# Patient Record
Sex: Male | Born: 1985 | Race: Black or African American | Hispanic: No | Marital: Married | Smoking: Never smoker
Health system: Southern US, Community
[De-identification: ages and names within clinical notes are randomized; demographics above are authoritative.]

## PROBLEM LIST (undated history)

## (undated) DIAGNOSIS — E785 Hyperlipidemia, unspecified: Secondary | ICD-10-CM

---

## 2013-12-20 ENCOUNTER — Encounter: Payer: Self-pay | Admitting: Emergency Medicine

## 2013-12-20 ENCOUNTER — Emergency Department
Admission: EM | Admit: 2013-12-20 | Discharge: 2013-12-20 | Disposition: A | Discharge status: Home / Self Care | Payer: BC Managed Care – PPO | Source: Home / Self Care | Attending: Family Medicine | Admitting: Family Medicine

## 2013-12-20 DIAGNOSIS — J029 Acute pharyngitis, unspecified: Secondary | ICD-10-CM

## 2013-12-20 HISTORY — DX: Hyperlipidemia, unspecified: E78.5

## 2013-12-20 LAB — POCT RAPID STREP A (OFFICE): Rapid Strep A Screen: NEGATIVE

## 2013-12-20 MED ORDER — AZITHROMYCIN 250 MG PO TABS: ORAL_TABLET | ORAL | Status: DC

## 2013-12-20 NOTE — ED Provider Notes (Signed)
CSN: [id number]     Arrival date & time 12/20/13  [id number] History   First MD Initiated Contact with Patient 12/20/13 1002     Chief Complaint  Patient presents with  . Sore Throat      HPI Comments: Patient was awakened at 3:30am today with a sore throat, hoarseness, and chest congestion.  Today he is fatigued, hoarse, has nasal congestion, and left ear feels clogged.  The history is provided by the patient.    Past Medical History  Diagnosis Date  . Hyperlipidemia     last checked one year ago   History reviewed. No pertinent past surgical history. Family History  Problem Relation Age of Onset  . Hypertension Father   . Diabetes Maternal Aunt    History  Substance Use Topics  . Smoking status: Never Smoker   . Smokeless tobacco: Not on file  . Alcohol Use: No    Review of Systems + sore throat + hoarse + cough No pleuritic pain No wheezing + nasal congestion ? post-nasal drainage No sinus pain/pressure No itchy/red eyes ? left earache No hemoptysis No SOB No fever/chills No nausea No vomiting No abdominal pain No diarrhea No urinary symptoms No skin rash + fatigue No myalgias No headache    Allergies  Review of patient's allergies indicates no known allergies.  Home Medications   Prior to Admission medications   Medication Sig Start Date End Date Taking? Authorizing Provider  azithromycin (ZITHROMAX Z-PAK) 250 MG tablet Take 2 tabs today; then begin one tab once daily for 4 more days. (Rx void after 12/28/13) 12/20/13   Lattie HawStephen A Beese, MD   BP 126/87  Pulse 72  Temp(Src) 98 F (36.7 C) (Oral)  Resp 16  Ht 5\' 10"  (1.778 m)  Wt 280 lb (127.007 kg)  BMI 40.18 kg/m2  SpO2 100% Physical Exam Nursing notes and Vital Signs reviewed. Appearance:  Patient appears stated age, and in no acute distress.  Patient is obese (BMI  40.2)           Eyes:  Pupils are equal, round, and reactive to light and accomodation.  Extraocular movement is intact.   Conjunctivae are not inflamed  Ears:  Canals normal.  Tympanic membranes normal.  Nose:  Mildly congested turbinates.  No sinus tenderness.   Pharynx:  Mildly erythematous Neck:  Supple.  Tender enlarged left posterior nodes are palpated bilaterally  Lungs:  Clear to auscultation.  Breath sounds are equal.  Heart:  Regular rate and rhythm without murmurs, rubs, or gallops.  Abdomen:  Nontender without masses or hepatosplenomegaly.  Bowel sounds are present.  No CVA or flank tenderness.  Extremities:  No edema.  No calf tenderness Skin:  No rash present.   ED Course  Procedures  none    Labs Reviewed  STREP A DNA PROBE  POCT RAPID STREP A (OFFICE) negative      MDM   1. Acute pharyngitis; suspect early viral URI    Throat culture pending.  There is no evidence of bacterial infection today.   Treat symptomatically for now: As cold symptoms develop, begin taking plain Mucinex (1200 mg guaifenesin) twice daily for cough and congestion.  May add Sudafed for sinus congestion.   Increase fluid intake, rest. May use Afrin nasal spray (or generic oxymetazoline) twice daily for about 5 days.  Also recommend using saline nasal spray several times daily and saline nasal irrigation (AYR is a common brand) Try warm salt water gargles for sore  throat.  Stop all antihistamines for now, and other non-prescription cough/cold preparations. May take Ibuprofen 200mg , 4 tabs every 8 hours with food for sore throat, headache, etc. Begin Azithromycin if throat culture positive, if not improving about one week, or if persistent fever develops (Given a prescription to hold, with an expiration date)  Follow-up with family doctor if not improving about10 days.      Lattie HawStephen A Beese, MD 12/20/13 [phone]

## 2013-12-20 NOTE — Discharge Instructions (Signed)
As cold symptoms develop, begin taking plain Mucinex (1200 mg guaifenesin) twice daily for cough and congestion.  May add Sudafed for sinus congestion.   Increase fluid intake, rest. May use Afrin nasal spray (or generic oxymetazoline) twice daily for about 5 days.  Also recommend using saline nasal spray several times daily and saline nasal irrigation (AYR is a common brand) Try warm salt water gargles for sore throat.  Stop all antihistamines for now, and other non-prescription cough/cold preparations. May take Ibuprofen 200mg , 4 tabs every 8 hours with food for sore throat, headache, etc. Begin Azithromycin if throat culture positive, if not improving about one week, or if persistent fever develops.  Follow-up with family doctor if not improving about10 days.    Salt Water Gargle This solution will help make your mouth and throat feel better. HOME CARE INSTRUCTIONS   Mix 1 teaspoon of salt in 8 ounces of warm water.  Gargle with this solution as much or often as you need or as directed. Swish and gargle gently if you have any sores or wounds in your mouth.  Do not swallow this mixture. Document Released: 03/28/2004 Document Revised: 09/16/2011 Document Reviewed: 08/19/2008 [facility] Patient Information 2014 [employer], [place].

## 2013-12-20 NOTE — ED Notes (Deleted)
Reports intermittent pain in left lateral upper rib cage: worse upon inspiration, sneezing, changing of position; started about 2 weeks ago at time of URI. Also has some intermittent pain at waist level left lateral.

## 2013-12-20 NOTE — ED Notes (Signed)
Reports onset sore throat last night; has had Strep throat so wanted to catch early.

## 2013-12-21 LAB — STREP A DNA PROBE: GASP: NEGATIVE

## 2013-12-23 ENCOUNTER — Telehealth: Payer: Self-pay | Admitting: Emergency Medicine

## 2014-04-23 ENCOUNTER — Emergency Department
Admission: EM | Admit: 2014-04-23 | Discharge: 2014-04-23 | Disposition: A | Discharge status: Home / Self Care | Payer: BC Managed Care – PPO | Source: Home / Self Care | Attending: Emergency Medicine | Admitting: Emergency Medicine

## 2014-04-23 ENCOUNTER — Encounter: Payer: Self-pay | Admitting: Emergency Medicine

## 2014-04-23 DIAGNOSIS — J0100 Acute maxillary sinusitis, unspecified: Secondary | ICD-10-CM

## 2014-04-23 MED ORDER — AMOXICILLIN-POT CLAVULANATE 875-125 MG PO TABS: 1.0000 | ORAL_TABLET | Freq: Two times a day (BID) | ORAL | Status: DC

## 2014-04-23 NOTE — ED Provider Notes (Signed)
CSN: [id number]     Arrival date & time 04/23/14  1324 History   First MD Initiated Contact with Patient 04/23/14 1357     Chief Complaint  Patient presents with  . Nasal Congestion  . Cough  . Hoarse  . Eye Pain  . Otalgia  . Headache   (Consider location/radiation/quality/duration/timing/severity/associated sxs/prior Treatment) Patient is a 28 y.o. male presenting with cough, eye pain, ear pain, and headaches. The history is provided by the patient. No language interpreter was used.  Cough Cough characteristics:  Productive Sputum characteristics:  Clear Severity:  Moderate Onset quality:  Gradual Duration:  6 days Timing:  Constant Progression:  Worsening Chronicity:  New Context: upper respiratory infection   Relieved by:  Nothing Worsened by:  Nothing tried Ineffective treatments:  None tried Associated symptoms: ear pain, headaches, rhinorrhea and shortness of breath   Risk factors: no recent infection   Eye Pain Associated symptoms include headaches and shortness of breath.  Otalgia Associated symptoms: cough, headaches and rhinorrhea   Headache Associated symptoms: cough, drainage, ear pain, eye pain and sinus pressure     Past Medical History  Diagnosis Date  . Hyperlipidemia     last checked one year ago   No past surgical history on file. Family History  Problem Relation Age of Onset  . Hypertension Father   . Diabetes Maternal Aunt    History  Substance Use Topics  . Smoking status: Never Smoker   . Smokeless tobacco: Not on file  . Alcohol Use: No    Review of Systems  Unable to perform ROS HENT: Positive for ear pain, postnasal drip, rhinorrhea and sinus pressure.   Eyes: Positive for pain.  Respiratory: Positive for cough and shortness of breath.   Neurological: Positive for headaches.  All other systems reviewed and are negative.   Allergies  Review of patient's allergies indicates no known allergies.  Home Medications   Prior to  Admission medications   Medication Sig Start Date End Date Taking? Authorizing Provider  azithromycin (ZITHROMAX Z-PAK) 250 MG tablet Take 2 tabs today; then begin one tab once daily for 4 more days. (Rx void after 12/28/13) 12/20/13   Lattie HawStephen A Beese, MD   BP 147/93  Pulse 90  Temp(Src) 97.5 F (36.4 C) (Oral)  Resp 18  Ht 5' 10.5" (1.791 m)  Wt 288 lb (130.636 kg)  BMI 40.73 kg/m2  SpO2 100% Physical Exam  Nursing note and vitals reviewed. Constitutional: He is oriented to person, place, and time. He appears well-developed and well-nourished.  HENT:  Head: Normocephalic.  Right Ear: External ear normal.  Mouth/Throat: Oropharynx is clear and moist.  Tender facial sinuses,     Eyes: Conjunctivae and EOM are normal. Pupils are equal, round, and reactive to light.  Neck: Normal range of motion.  Pulmonary/Chest: Effort normal.  Abdominal: Soft. He exhibits no distension.  Musculoskeletal: Normal range of motion.  Neurological: He is alert and oriented to person, place, and time.  Psychiatric: He has a normal mood and affect.    ED Course  Procedures (including critical care time) Labs Review Labs Reviewed - No data to display  Imaging Review No results found.   MDM Pt has had symptoms for 6 days,  Green nasal drainage and facila discomfort   1. Acute maxillary sinusitis, recurrence not specified    Augmentin Return if any problems. AVS    Elson AreasLeslie K Halim Surrette, PA-C 04/23/14 1408

## 2014-04-23 NOTE — Discharge Instructions (Signed)

## 2014-04-23 NOTE — ED Notes (Signed)
Reports feeling ill x 6 days; no fever; does have congestion, cough, hoarseness, ear pain, eye pain, headache and body aches.

## 2014-04-25 NOTE — ED Provider Notes (Signed)
Medical history/examination/treatment/procedure(s) were performed by non-physician provider and as supervising physician I was immediately available for consultation/collaboration.   Lajean Manesavid Massey, MD 04/25/14 1010

## 2014-08-11 ENCOUNTER — Emergency Department
Admission: EM | Admit: 2014-08-11 | Discharge: 2014-08-11 | Disposition: A | Discharge status: Home / Self Care | Payer: BLUE CROSS/BLUE SHIELD | Source: Home / Self Care | Attending: Emergency Medicine | Admitting: Emergency Medicine

## 2014-08-11 ENCOUNTER — Encounter: Payer: Self-pay | Admitting: Emergency Medicine

## 2014-08-11 DIAGNOSIS — H65112 Acute and subacute allergic otitis media (mucoid) (sanguinous) (serous), left ear: Secondary | ICD-10-CM

## 2014-08-11 MED ORDER — AMOXICILLIN-POT CLAVULANATE 875-125 MG PO TABS: 1.0000 | ORAL_TABLET | Freq: Two times a day (BID) | ORAL | Status: DC

## 2014-08-11 NOTE — ED Notes (Signed)
Left ear pian x 2 days

## 2014-08-11 NOTE — ED Provider Notes (Signed)
CSN: [id number]     Arrival date & time 08/11/14  9147 History   First MD Initiated Contact with Patient 08/11/14 [phone]     Chief Complaint  Patient presents with  . Otalgia   (Consider location/radiation/quality/duration/timing/severity/associated sxs/prior Treatment) HPI URI HISTORY  Steven Collier is a 29 y.o. male who complains of onset of cold symptoms for 2 days, progressing to moderate left ear pain. Some associated postnasal drainage and sinus pain and congestion.  Have been using over-the-counter treatment which helps a little bit.  No chills/sweats No definite Fever  +  Nasal congestion Minimal Discolored Post-nasal drainage Mild sinus pain/pressure Mild sore throat  No cough No wheezing No chest congestion No hemoptysis No shortness of breath No pleuritic pain  No itchy/red eyes No earache  No nausea No vomiting No abdominal pain No diarrhea  No skin rashes +  Fatigue No myalgias No headache   Past Medical History  Diagnosis Date  . Hyperlipidemia     last checked one year ago   History reviewed. No pertinent past surgical history. Family History  Problem Relation Age of Onset  . Hypertension Father   . Diabetes Maternal Aunt    History  Substance Use Topics  . Smoking status: Never Smoker   . Smokeless tobacco: Not on file  . Alcohol Use: No    Review of Systems  All other systems reviewed and are negative.   Allergies  Review of patient's allergies indicates no known allergies.  Home Medications   Prior to Admission medications   Medication Sig Start Date End Date Taking? Authorizing Provider  amoxicillin-clavulanate (AUGMENTIN) 875-125 MG per tablet Take 1 tablet by mouth 2 (two) times daily. For 10 days. Take with food. 08/11/14   Lajean Manes, MD   BP 133/85 mmHg  Pulse 56  Temp(Src) 98.1 F (36.7 C) (Oral)  Ht  (1.778 m)  Wt 290 lb (131.543 kg)  BMI 41.61 kg/m2  SpO2 98% Physical Exam  Constitutional: He is oriented to  person, place, and time. He appears well-developed and well-nourished. No distress.  HENT:  Head: Normocephalic and atraumatic.  Right Ear: Tympanic membrane, external ear and ear canal normal.  Left Ear: External ear and ear canal normal. Tympanic membrane is injected and erythematous.  Nose: Mucosal edema and rhinorrhea present. Right sinus exhibits maxillary sinus tenderness. Left sinus exhibits maxillary sinus tenderness.  Mouth/Throat: Oropharynx is clear and moist. No oral lesions.  Left ear: TM red. Intact. External canal: Moderate wax, but patent. No swelling or inflammation of the external canal  Eyes: Conjunctivae are normal. No scleral icterus.  Neck: Neck supple.  Cardiovascular: Normal rate, regular rhythm and normal heart sounds.   Pulmonary/Chest: Effort normal and breath sounds normal.  Lymphadenopathy:    He has no cervical adenopathy.  Neurological: He is alert and oriented to person, place, and time.  Skin: Skin is warm and dry.  Nursing note and vitals reviewed.   ED Course  Procedures (including critical care time) Labs Review Labs Reviewed - No data to display  Imaging Review No results found.   MDM   1. Acute mucoid otitis media of left ear    with early maxillary sinusitis. No evidence of left otitis externa. He has mild to moderate wax left ear canal but no obstruction.  Treatment options discussed, as well as risks, benefits, alternatives. We discussed home irrigation of ear canals, discussed how to do this safely. He declined irrigation of left ear canal here in urgent  care today. Patient voiced understanding and agreement with the following plans: Augmentin 875 twice a day 10 days Mucinex and other symptomatic care. Tylenol or ibuprofen when necessary pain. Follow-up with your primary care doctor in 5-7 days if not improving, or sooner if symptoms become worse. Precautions discussed. Red flags discussed. Questions invited and answered. Patient  voiced understanding and agreement.      Lajean Manesavid Massey, MD 08/11/14 [phone]

## 2015-12-27 ENCOUNTER — Encounter: Payer: Self-pay | Admitting: Emergency Medicine

## 2015-12-27 ENCOUNTER — Emergency Department
Admission: EM | Admit: 2015-12-27 | Discharge: 2015-12-27 | Disposition: A | Discharge status: Home / Self Care | Payer: BLUE CROSS/BLUE SHIELD | Source: Home / Self Care | Attending: Family Medicine | Admitting: Family Medicine

## 2015-12-27 DIAGNOSIS — L298 Other pruritus: Secondary | ICD-10-CM

## 2015-12-27 MED ORDER — TRIAMCINOLONE ACETONIDE 0.1 % EX CREA: 1.0000 "application " | TOPICAL_CREAM | Freq: Two times a day (BID) | CUTANEOUS | Status: DC

## 2015-12-27 MED ORDER — HYDROXYZINE HCL 25 MG PO TABS: 25.0000 mg | ORAL_TABLET | Freq: Four times a day (QID) | ORAL | Status: DC | PRN

## 2015-12-27 MED ORDER — PREDNISONE 20 MG PO TABS: ORAL_TABLET | ORAL | Status: DC

## 2015-12-27 MED ORDER — METHYLPREDNISOLONE ACETATE 40 MG/ML IJ SUSP
40.0000 mg | Freq: Once | INTRAMUSCULAR | Status: AC
  Administered 2015-12-27: 40 mg via INTRAMUSCULAR

## 2015-12-27 NOTE — Discharge Instructions (Signed)
Atarax (hydroxizine) is an antihistamine that can be taken to help with itching. This medication can cause drowsiness so do not drive or drink alcohol while taking.    You were given a shot of Depo-medrol (a steroid) today to help with itching and swelling from a likely allergic reaction.  You have been prescribed 3 days of prednisone, an oral steroid.  You may start this medication tomorrow with breakfast.

## 2015-12-27 NOTE — ED Provider Notes (Signed)
CSN: [id number]     Arrival date & time 12/27/15  1002 History   First MD Initiated Contact with Patient 12/27/15 1016     Chief Complaint  Patient presents with  . Rash   (Consider location/radiation/quality/duration/timing/severity/associated sxs/prior Treatment) HPI Steven Collier is a 30 y.o. male presenting to UC with c/o erythematous moderately pruritic rash on arms and legs for 1 week. Rash also has a mild burning sensation. He has used OTC benadryl and hydrocortisone cream without relief. no known allergies.  He notes a while back he did have an allergic reaction to laundry detergent at his grandparents a few months ago but denies recent new soaps, lotions, detergents, foods, or medications. Denies swelling of mouth or difficulty breathing. Denies n/v.    Past Medical History  Diagnosis Date  . Hyperlipidemia     last checked one year ago   History reviewed. No pertinent past surgical history. Family History  Problem Relation Age of Onset  . Hypertension Father   . Diabetes Maternal Aunt    Social History  Substance Use Topics  . Smoking status: Never Smoker   . Smokeless tobacco: None  . Alcohol Use: No    Review of Systems  Constitutional: Negative for fever and chills.  Respiratory: Negative for shortness of breath, wheezing and stridor.   Gastrointestinal: Negative for nausea and vomiting.  Skin: Positive for color change and rash. Negative for wound.    Allergies  Review of patient's allergies indicates no known allergies.  Home Medications   Prior to Admission medications   Medication Sig Start Date End Date Taking? Authorizing Provider  amoxicillin-clavulanate (AUGMENTIN) 875-125 MG per tablet Take 1 tablet by mouth 2 (two) times daily. For 10 days. Take with food. 08/11/14   Lajean Manes, MD  hydrOXYzine (ATARAX/VISTARIL) 25 MG tablet Take 1 tablet (25 mg total) by mouth every 6 (six) hours as needed for itching. 12/27/15   Junius Finner, PA-C  predniSONE  (DELTASONE) 20 MG tablet 3 tabs po day one, then 2 po daily x 4 days 12/27/15   Junius Finner, PA-C  triamcinolone cream (KENALOG) 0.1 % Apply 1 application topically 2 (two) times daily. 12/27/15   Junius Finner, PA-C   Meds Ordered and Administered this Visit   Medications  methylPREDNISolone acetate (DEPO-MEDROL) injection 40 mg (40 mg Intramuscular Given 12/27/15 1027)    BP 151/90 mmHg  Pulse 54  Temp(Src) 97.4 F (36.3 C) (Oral)  Ht  (1.778 m)  Wt 276 lb (125.193 kg)  BMI 39.60 kg/m2  SpO2 98% No data found.   Physical Exam  Constitutional: He is oriented to person, place, and time. He appears well-developed and well-nourished.  HENT:  Head: Normocephalic and atraumatic.  Eyes: EOM are normal.  Neck: Normal range of motion.  Cardiovascular: Bradycardia present.   Mild bradycardia, regular rhythm  Pulmonary/Chest: Effort normal.  Musculoskeletal: Normal range of motion.  Neurological: He is alert and oriented to person, place, and time.  Skin: Skin is warm and dry. Rash noted. There is erythema.  Diffuse erythematous maculopapular rash on arms and legs. Small areas of dried skin.   Psychiatric: He has a normal mood and affect. His behavior is normal.  Nursing note and vitals reviewed.   ED Course  Procedures (including critical care time)  Labs Review Labs Reviewed - No data to display  Imaging Review No results found.    MDM   1. Pruritic erythematous rash    Rash c/w allergic reaction. No evidence  of anaphylaxis. Vitals- unremarkable. No evidence of underlying infection.   Tx in UC: depo-medrol 40mg  IM  Rx: Prednisone, triamcinolone, and atarax  Encouraged f/u with PCP in 1 week if not improving, sooner if worsening. Patient verbalized understanding and agreement with treatment plan.    Junius Finnerrin O'Malley, PA-C 12/27/15 1108

## 2015-12-27 NOTE — ED Notes (Signed)
Rash on arms and legs x 1 week, itches and burns

## 2016-06-01 ENCOUNTER — Telehealth: Payer: Self-pay | Admitting: Emergency Medicine

## 2016-06-01 NOTE — Telephone Encounter (Signed)
Pt called regarding an abcess on his back, states that it is draining and wanted to know if there's anything else he needs to do?  Pt states that it's no pus, just blood coming out.  Advised hard to make a recommendation without looking at the area.  Pt will decide if he wants to come in and have the area looked at.  TMartin,CMA

## 2016-07-29 ENCOUNTER — Emergency Department
Admission: EM | Admit: 2016-07-29 | Discharge: 2016-07-29 | Disposition: A | Discharge status: Home / Self Care | Payer: BLUE CROSS/BLUE SHIELD | Source: Home / Self Care

## 2016-07-29 ENCOUNTER — Encounter: Payer: Self-pay | Admitting: Emergency Medicine

## 2016-07-29 ENCOUNTER — Emergency Department (INDEPENDENT_AMBULATORY_CARE_PROVIDER_SITE_OTHER): Payer: BLUE CROSS/BLUE SHIELD

## 2016-07-29 DIAGNOSIS — I517 Cardiomegaly: Secondary | ICD-10-CM

## 2016-07-29 DIAGNOSIS — R222 Localized swelling, mass and lump, trunk: Secondary | ICD-10-CM

## 2016-07-29 NOTE — ED Provider Notes (Signed)
CSN: [id number]     Arrival date & time 07/29/16  1151 History   None    Chief Complaint  Patient presents with  . Mass   (Consider location/radiation/quality/duration/timing/severity/associated sxs/prior Treatment) Pt reports he has a knot on his chest.  Pt reports he noticed 3 days ago.  Pt has slight tenderness.  No known injury no fever no cough   The history is provided by the patient. No language interpreter was used.    Past Medical History:  Diagnosis Date  . Hyperlipidemia    last checked one year ago   History reviewed. No pertinent surgical history. Family History  Problem Relation Age of Onset  . Hypertension Father   . Diabetes Maternal Aunt    Social History  Substance Use Topics  . Smoking status: Never Smoker  . Smokeless tobacco: Never Used  . Alcohol use No    Review of Systems  All other systems reviewed and are negative.   Allergies  Patient has no known allergies.  Home Medications   Prior to Admission medications   Not on File   Meds Ordered and Administered this Visit  Medications - No data to display  BP 141/98 (BP Location: Left Arm)   Pulse (!) 55   Temp 97.7 F (36.5 C) (Oral)   Ht 5\' 10"  (1.778 m)   Wt 273 lb (123.8 kg)   SpO2 100%   BMI 39.17 kg/m  No data found.   Physical Exam  Constitutional: He appears well-developed and well-nourished.  HENT:  Head: Normocephalic.  Pulmonary/Chest: He exhibits tenderness.  5mm tender area left of sternum, slightly tender   Abdominal: Soft.    Urgent Care Course     Procedures (including critical care time)  Labs Review Labs Reviewed - No data to display  Imaging Review Dg Chest 2 View  Result Date: 07/29/2016 CLINICAL DATA:  Nonpainful mass felt by patient inferior to the left sternoclavicular joint for the past 3 days. EXAM: CHEST  2 VIEW COMPARISON:  None. FINDINGS: Borderline enlarged cardiac silhouette. Clear lungs with normal vascularity. Normal appearing bones.  IMPRESSION: Borderline cardiomegaly. No acute abnormality. No radiographically visible mass. Electronically Signed   By: Beckie SaltsSteven  Reid M.D.   On: 07/29/2016 13:41     Visual Acuity Review  Right Eye Distance:   Left Eye Distance:   Bilateral Distance:    Right Eye Near:   Left Eye Near:    Bilateral Near:         MDM   1. Chest wall mass    I advised pt to follow up with primary for recheck.  Pt advised he may need ultrasound if area persist    Elson AreasLeslie K Alvin Rubano, [place] JerseyPA-C 07/29/16 1417

## 2016-07-29 NOTE — Discharge Instructions (Signed)
Have your Physicain recheck area of swelling.  If area is still present they may want to schedule you for an ultrasound

## 2016-07-29 NOTE — ED Triage Notes (Signed)
Lump in upper left center of chest x 3 days, nothing visible to the eye

## 2018-02-01 IMAGING — DX DG CHEST 2V
2 series · 2 of 2 positions shown · non-contrast
Comparison: None.

CLINICAL DATA: Nonpainful mass felt by patient inferior to the left
sternoclavicular joint for the past 3 days.

EXAM:
CHEST  2 VIEW

[chest pa]
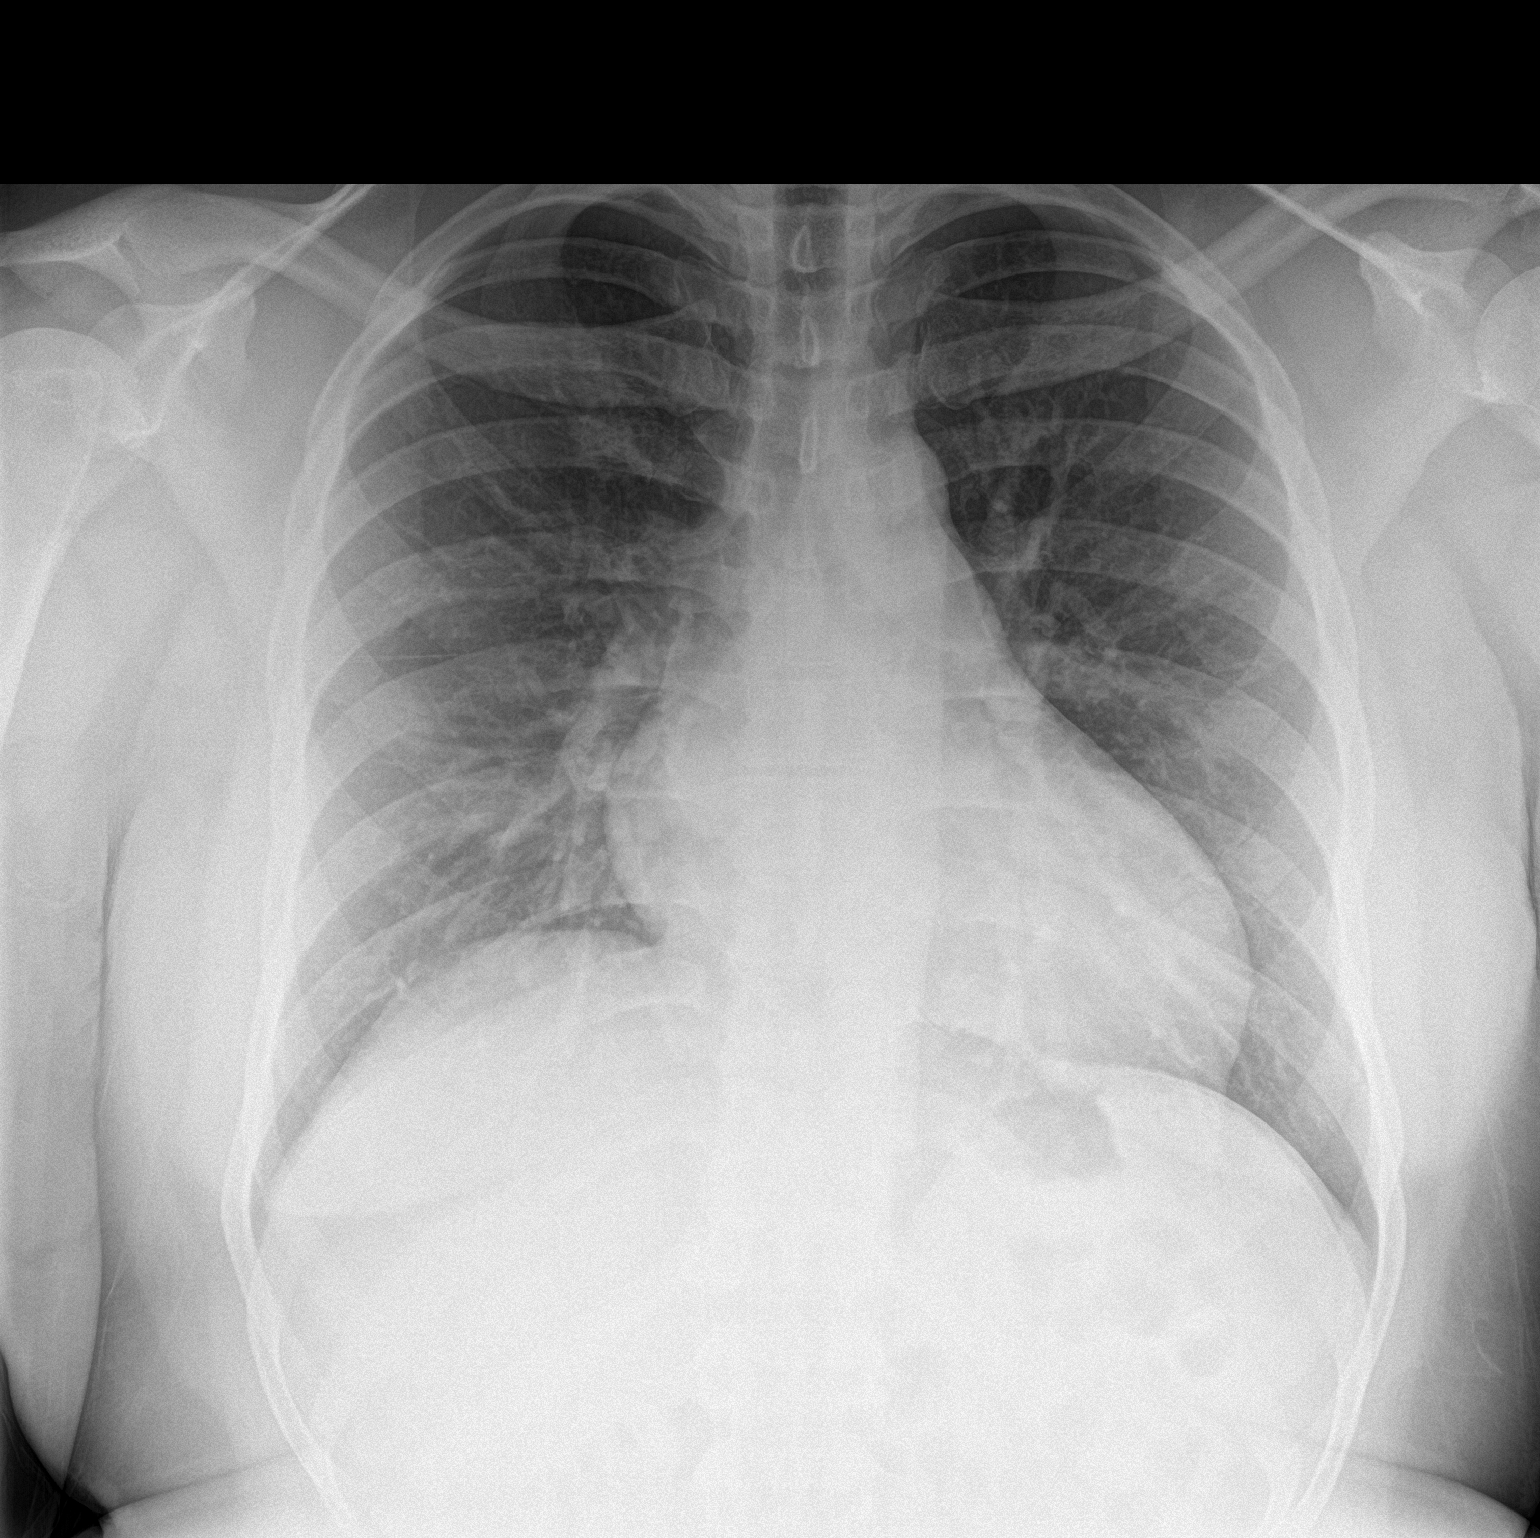

[chest lat]
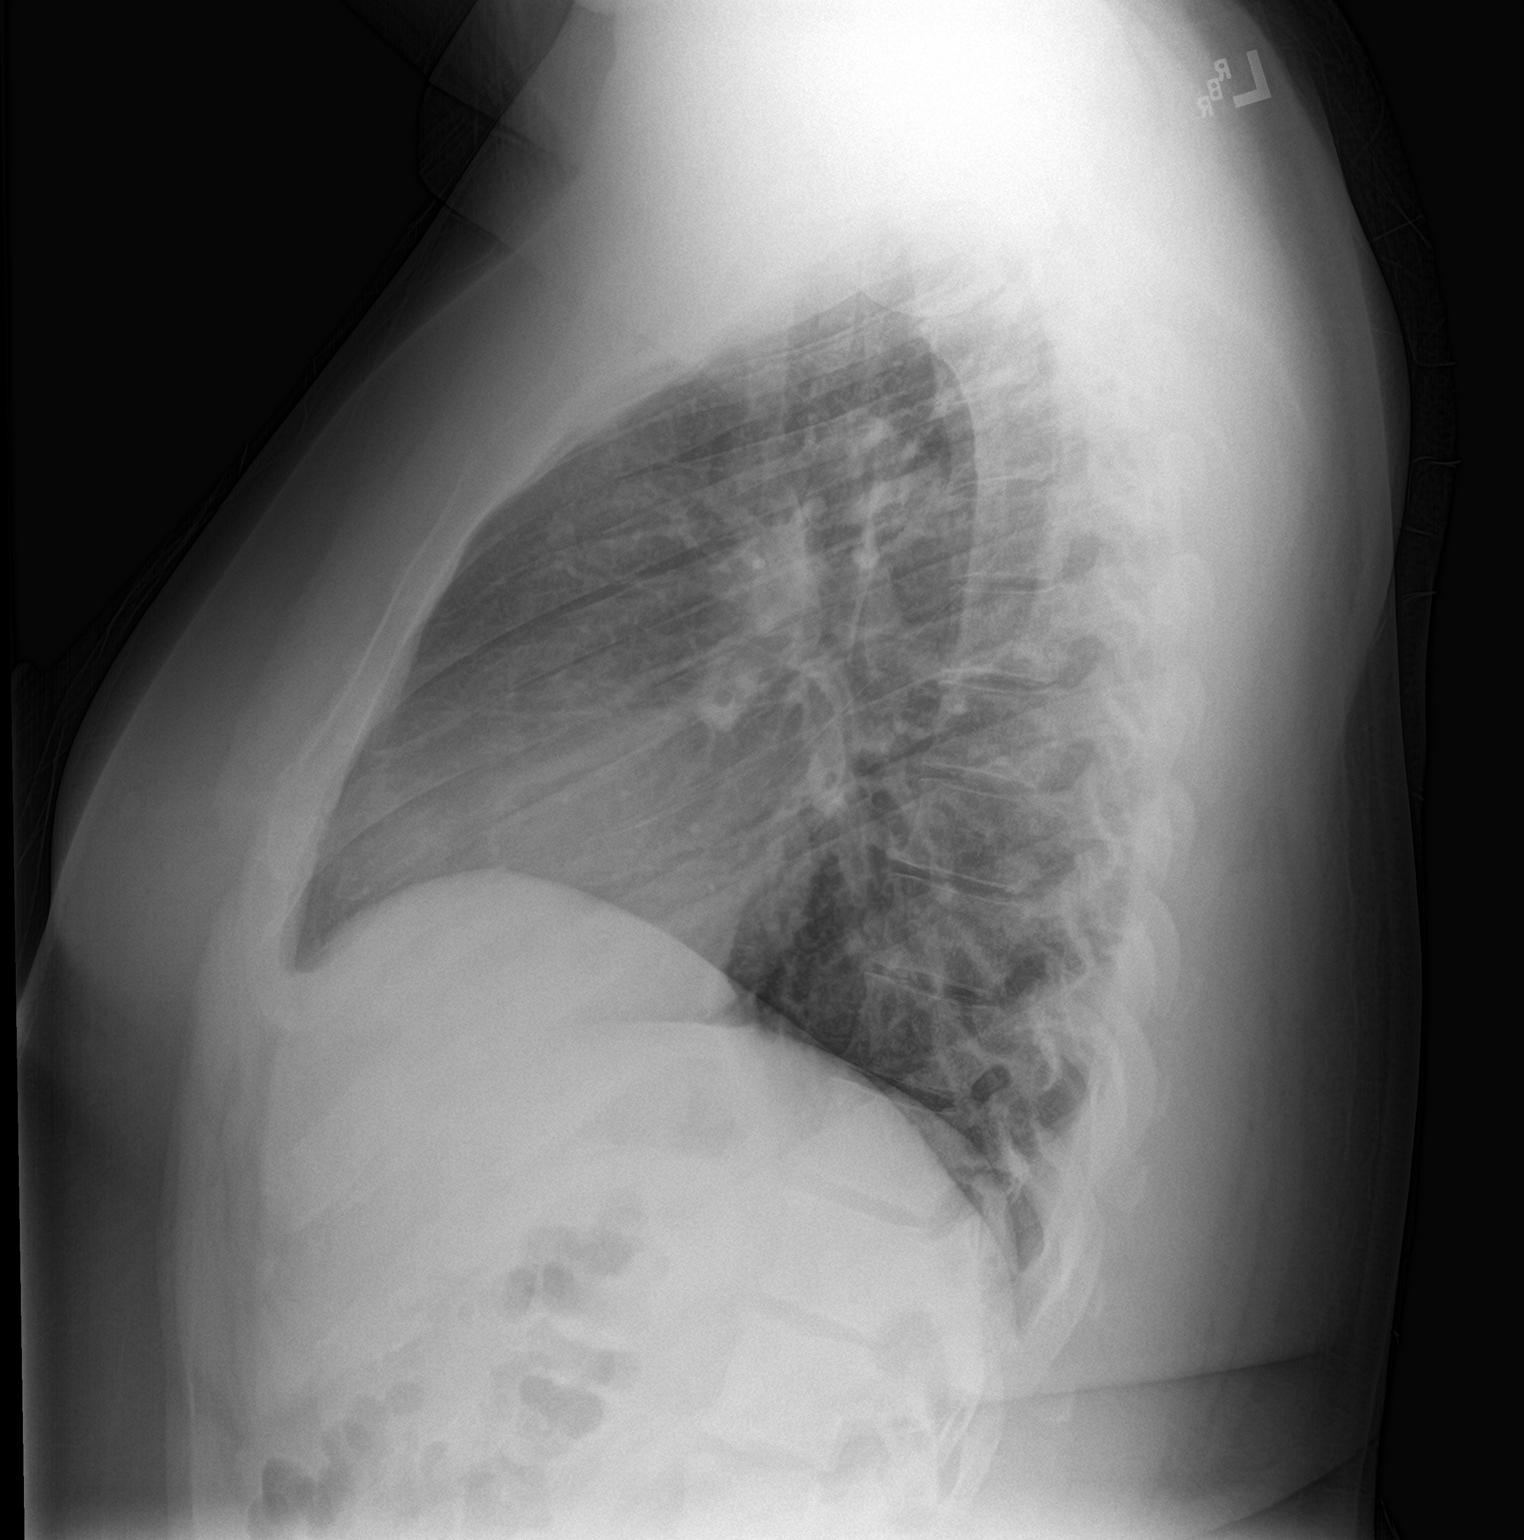

[2 of 2 positions shown; findings below may reference images not displayed]

FINDINGS: Borderline enlarged cardiac silhouette. Clear lungs with normal
vascularity. Normal appearing bones.
IMPRESSION: Borderline cardiomegaly. No acute abnormality. No radiographically
visible mass.
